# Patient Record
Sex: Male | Born: 1970 | Race: Black or African American | Hispanic: No | Marital: Married | Smoking: Never smoker
Health system: Southern US, Community
[De-identification: ages and names within clinical notes are randomized; demographics above are authoritative.]

---

## 2001-09-12 ENCOUNTER — Emergency Department (HOSPITAL_COMMUNITY): Admission: AC | Admit: 2001-09-12 | Discharge: 2001-09-13 | Payer: Self-pay

## 2001-09-13 ENCOUNTER — Encounter: Payer: Self-pay | Admitting: Emergency Medicine

## 2012-07-03 ENCOUNTER — Encounter: Payer: Self-pay | Admitting: Internal Medicine

## 2012-07-03 ENCOUNTER — Ambulatory Visit (INDEPENDENT_AMBULATORY_CARE_PROVIDER_SITE_OTHER): Payer: Federal, State, Local not specified - PPO | Admitting: Internal Medicine

## 2012-07-03 VITALS — BP 130/86 | HR 69 | Temp 98.8°F | Resp 20 | Wt 284.8 lb

## 2012-07-03 DIAGNOSIS — K439 Ventral hernia without obstruction or gangrene: Secondary | ICD-10-CM | POA: Insufficient documentation

## 2012-07-03 NOTE — Patient Instructions (Signed)
Hernia  A hernia occurs when an internal organ pushes out through a weak spot in the abdominal wall. Hernias most commonly occur in the groin and around the navel. Hernias often can be pushed back into place (reduced). Most hernias tend to get worse over time. Some abdominal hernias can get stuck in the opening (irreducible or incarcerated hernia) and cannot be reduced. An irreducible abdominal hernia which is tightly squeezed into the opening is at risk for impaired blood supply (strangulated hernia). A strangulated hernia is a medical emergency. Because of the risk for an irreducible or strangulated hernia, surgery may be recommended to repair a hernia.  CAUSES    Heavy lifting.   Prolonged coughing.   Straining to have a bowel movement.   A cut (incision) made during an abdominal surgery.  HOME CARE INSTRUCTIONS    Bed rest is not required. You may continue your normal activities.   Avoid lifting more than 10 pounds (4.5 kg) or straining.   Cough gently. If you are a smoker it is best to stop. Even the best hernia repair can break down with the continual strain of coughing. Even if you do not have your hernia repaired, a cough will continue to aggravate the problem.   Do not wear anything tight over your hernia. Do not try to keep it in with an outside bandage or truss. These can damage abdominal contents if they are trapped within the hernia sac.   Eat a normal diet.   Avoid constipation. Straining over long periods of time will increase hernia size and encourage breakdown of repairs. If you cannot do this with diet alone, stool softeners may be used.  SEEK IMMEDIATE MEDICAL CARE IF:    You have a fever.   You develop increasing abdominal pain.   You feel nauseous or vomit.   Your hernia is stuck outside the abdomen, looks discolored, feels hard, or is tender.   You have any changes in your bowel habits or in the hernia that are unusual for you.   You have increased pain or swelling around the  hernia.   You cannot push the hernia back in place by applying gentle pressure while lying down.  MAKE SURE YOU:    Understand these instructions.   Will watch your condition.   Will get help right away if you are not doing well or get worse.  Document Released: 12/09/2005 Document Revised: 11/28/2011 Document Reviewed: 07/28/2008  [employer] Patient Information 2012 [employer].

## 2012-07-03 NOTE — Progress Notes (Signed)
  Subjective:    Patient ID: Louis Glover, male    DOB: [birth year], 41 y.o.   MRN: [id number]  HPI  New to me he wants a second opinion about a hernia in his abd wall that developed more than one year ago, it does not bother him but he tells me that his wife is concerned about the appearance. He saw his PCP Dr. Arvella Nigh recently and has been referred to a general surgeon.  Review of Systems  Constitutional: Negative.   HENT: Negative.   Eyes: Negative.   Respiratory: Negative.   Cardiovascular: Negative.   Gastrointestinal: Negative for nausea, vomiting, abdominal pain, diarrhea, constipation, blood in stool and abdominal distention.  Genitourinary: Negative.   Musculoskeletal: Negative.   Skin: Negative.   Neurological: Negative.   Hematological: Negative.   Psychiatric/Behavioral: Negative.        Objective:   Physical Exam  Vitals reviewed. Constitutional: He is oriented to person, place, and time. He appears well-developed and well-nourished. No distress.  HENT:  Head: Normocephalic and atraumatic.  Mouth/Throat: Oropharynx is clear and moist. No oropharyngeal exudate.  Eyes: Conjunctivae are normal. Right eye exhibits no discharge. Left eye exhibits no discharge. No scleral icterus.  Neck: Normal range of motion. Neck supple. No JVD present. No tracheal deviation present. No thyromegaly present.  Cardiovascular: Normal rate, regular rhythm, normal heart sounds and intact distal pulses.  Exam reveals no gallop and no friction rub.   No murmur heard. Pulmonary/Chest: Effort normal and breath sounds normal. No stridor. No respiratory distress. He has no wheezes. He has no rales. He exhibits no tenderness.  Abdominal: Normal appearance and bowel sounds are normal. He exhibits no shifting dullness, no distension, no pulsatile liver, no fluid wave, no abdominal bruit, no ascites, no pulsatile midline mass and no mass. There is no hepatosplenomegaly, splenomegaly or  hepatomegaly. There is no tenderness. There is no rebound, no guarding and no CVA tenderness. A hernia is present. Hernia confirmed positive in the ventral area (hernia is jsut above the umbilicus nd protrudes only with valsalva). Hernia confirmed negative in the right inguinal area and confirmed negative in the left inguinal area.  Musculoskeletal: Normal range of motion. He exhibits no edema and no tenderness.  Lymphadenopathy:    He has no cervical adenopathy.  Neurological: He is oriented to person, place, and time.  Skin: Skin is warm and dry. No rash noted. He is not diaphoretic. No erythema. No pallor.  Psychiatric: He has a normal mood and affect. His behavior is normal. Judgment and thought content normal.          Assessment & Plan:

## 2012-07-03 NOTE — Assessment & Plan Note (Signed)
I gave him pt ed material about the hernia, he has an appt set up with Dr. Rayburn Ma, he will let me know if he develops any pain or any other symptoms

## 2012-07-14 ENCOUNTER — Encounter (INDEPENDENT_AMBULATORY_CARE_PROVIDER_SITE_OTHER): Payer: Self-pay | Admitting: Surgery

## 2012-08-03 ENCOUNTER — Ambulatory Visit (INDEPENDENT_AMBULATORY_CARE_PROVIDER_SITE_OTHER): Payer: Self-pay | Admitting: Surgery

## 2012-08-10 ENCOUNTER — Emergency Department (HOSPITAL_COMMUNITY)
Admission: EM | Admit: 2012-08-10 | Discharge: 2012-08-10 | Disposition: A | Discharge status: Home / Self Care | Payer: Federal, State, Local not specified - PPO | Attending: Emergency Medicine | Admitting: Emergency Medicine

## 2012-08-10 ENCOUNTER — Encounter (HOSPITAL_COMMUNITY): Payer: Self-pay | Admitting: *Deleted

## 2012-08-10 DIAGNOSIS — R197 Diarrhea, unspecified: Secondary | ICD-10-CM | POA: Insufficient documentation

## 2012-08-10 DIAGNOSIS — R109 Unspecified abdominal pain: Secondary | ICD-10-CM | POA: Insufficient documentation

## 2012-08-10 LAB — URINALYSIS, ROUTINE W REFLEX MICROSCOPIC
Bilirubin Urine: NEGATIVE
Glucose, UA: NEGATIVE mg/dL
Hgb urine dipstick: NEGATIVE
Ketones, ur: NEGATIVE mg/dL
Leukocytes, UA: NEGATIVE
Nitrite: NEGATIVE
Protein, ur: NEGATIVE mg/dL
Specific Gravity, Urine: 1.021 (ref 1.005–1.030)
Urobilinogen, UA: 0.2 mg/dL (ref 0.0–1.0)
pH: 8.5 — ABNORMAL HIGH (ref 5.0–8.0)

## 2012-08-10 LAB — BASIC METABOLIC PANEL
BUN: 12 mg/dL (ref 6–23)
CO2: 27 mEq/L (ref 19–32)
Calcium: 9.3 mg/dL (ref 8.4–10.5)
Chloride: 103 mEq/L (ref 96–112)
Creatinine, Ser: 0.93 mg/dL (ref 0.50–1.35)
GFR calc Af Amer: >90 mL/min (ref >90)
GFR calc non Af Amer: >90 mL/min (ref >90)
Glucose, Bld: 134 mg/dL — ABNORMAL HIGH (ref 70–99)
Potassium: 3.9 mEq/L (ref 3.5–5.1)
Sodium: 140 mEq/L (ref 135–145)

## 2012-08-10 LAB — CBC WITH DIFFERENTIAL/PLATELET
Basophils Absolute: 0 10*3/uL (ref 0.0–0.1)
Basophils Relative: 0 % (ref 0–1)
Eosinophils Absolute: 0 10*3/uL (ref 0.0–0.7)
Eosinophils Relative: 0 % (ref 0–5)
HCT: 43.8 % (ref 39.0–52.0)
Hemoglobin: 16.1 g/dL (ref 13.0–17.0)
Lymphocytes Relative: 11 % — ABNORMAL LOW (ref 12–46)
Lymphs Abs: 1.1 10*3/uL (ref 0.7–4.0)
MCH: 30.7 pg (ref 26.0–34.0)
MCHC: 36.8 g/dL — ABNORMAL HIGH (ref 30.0–36.0)
MCV: 83.6 fL (ref 78.0–100.0)
Monocytes Absolute: 0.3 10*3/uL (ref 0.1–1.0)
Monocytes Relative: 3 % (ref 3–12)
Neutro Abs: 8.5 10*3/uL — ABNORMAL HIGH (ref 1.7–7.7)
Neutrophils Relative %: 86 % — ABNORMAL HIGH (ref 43–77)
Platelets: 215 10*3/uL (ref 150–400)
RBC: 5.24 MIL/uL (ref 4.22–5.81)
RDW: 12.4 % (ref 11.5–15.5)
WBC: 10 10*3/uL (ref 4.0–10.5)

## 2012-08-10 MED ORDER — MORPHINE SULFATE 4 MG/ML IJ SOLN
6.0000 mg | Freq: Once | INTRAMUSCULAR | Status: AC
  Administered 2012-08-10: 6 mg via INTRAVENOUS
  Filled 2012-08-10: qty 2

## 2012-08-10 MED ORDER — ONDANSETRON HCL 4 MG/2ML IJ SOLN
4.0000 mg | Freq: Once | INTRAMUSCULAR | Status: AC
  Administered 2012-08-10: 4 mg via INTRAVENOUS
  Filled 2012-08-10: qty 2

## 2012-08-10 MED ORDER — SODIUM CHLORIDE 0.9 % IV BOLUS (SEPSIS)
1000.0000 mL | Freq: Once | INTRAVENOUS | Status: AC
  Administered 2012-08-10: 1000 mL via INTRAVENOUS

## 2012-08-10 MED ORDER — ONDANSETRON 8 MG PO TBDP: 8.0000 mg | ORAL_TABLET | Freq: Three times a day (TID) | ORAL | Status: AC | PRN

## 2012-08-10 MED ORDER — HYDROCODONE-ACETAMINOPHEN 5-325 MG PO TABS: 1.0000 | ORAL_TABLET | ORAL | Status: AC | PRN

## 2012-08-10 NOTE — ED Notes (Signed)
Pt is here with lower abdominal pain and cramping with diarrhea and reports some blood in the stool.  No vomiting but nauseated.

## 2012-08-10 NOTE — ED Provider Notes (Signed)
History     CSN: [id number]  Arrival date & time 08/10/12  [id number]   First MD Initiated Contact with Patient 08/10/12 [phone]      Chief Complaint  Patient presents with  . Abdominal Pain    (Consider location/radiation/quality/duration/timing/severity/associated sxs/prior treatment) The history is provided by the patient.   patient reports 24 hours of lower abdominal crampy-type pain with associated diarrhea and nausea.  He denies vomiting.  He reports he noted a small amount of blood in his stool this morning.  He has no focal lower abdominal tenderness.  He denies fevers chills.  His had no dysuria or urinary frequency.  Nothing worsens or improves his symptoms.  His pain is mild to moderate in severity  History reviewed. No pertinent past medical history.  History reviewed. No pertinent past surgical history.  Family History  Problem Relation Age of Onset  . Hyperlipidemia Other   . Diabetes Other   . Hyperlipidemia Mother   . Hypertension Mother   . Diabetes Mother   . Hyperlipidemia Father   . Hypertension Father   . Diabetes Father   . Cancer Neg Hx   . Early death Neg Hx   . Heart disease Neg Hx   . Kidney disease Neg Hx   . Stroke Neg Hx     History  Substance Use Topics  . Smoking status: Never Smoker   . Smokeless tobacco: Never Used  . Alcohol Use: 3.0 oz/week    3 Glasses of wine, 2 Shots of liquor per week     occ      Review of Systems  Gastrointestinal: Positive for abdominal pain.  All other systems reviewed and are negative.    Allergies  Shellfish allergy  Home Medications   Current Outpatient Rx  Name Route Sig Dispense Refill  . VITAMIN D PO Oral Take 1 tablet by mouth daily.    Marland Kitchen GARLIC PO Oral Take 1 tablet by mouth daily.    Marland Kitchen VITAMIN E PO Oral Take 1 tablet by mouth daily.    Marland Kitchen HYDROCODONE-ACETAMINOPHEN 5-325 MG PO TABS Oral Take 1 tablet by mouth every 4 (four) hours as needed for pain. 15 tablet 0  . ONDANSETRON 8 MG PO TBDP Oral  Take 1 tablet (8 mg total) by mouth every 8 (eight) hours as needed for nausea. 12 tablet 0    BP 123/78  Pulse 78  Temp 98.4 F (36.9 C) (Oral)  Resp 16  SpO2 98%  Physical Exam  Nursing note and vitals reviewed. Constitutional: He is oriented to person, place, and time. He appears well-developed and well-nourished.  HENT:  Head: Normocephalic and atraumatic.  Eyes: EOM are normal.  Neck: Normal range of motion.  Cardiovascular: Normal rate, regular rhythm, normal heart sounds and intact distal pulses.   Pulmonary/Chest: Effort normal and breath sounds normal. No respiratory distress.  Abdominal: Soft. He exhibits no distension. There is no tenderness.  Musculoskeletal: Normal range of motion.  Neurological: He is alert and oriented to person, place, and time.  Skin: Skin is warm and dry.  Psychiatric: He has a normal mood and affect. Judgment normal.    ED Course  Procedures (including critical care time)  Labs Reviewed  URINALYSIS, ROUTINE W REFLEX MICROSCOPIC - Abnormal; Notable for the following:    pH 8.5 (*)     All other components within normal limits  BASIC METABOLIC PANEL - Abnormal; Notable for the following:    Glucose, Bld 134 (*)  All other components within normal limits  CBC WITH DIFFERENTIAL - Abnormal; Notable for the following:    MCHC 36.8 (*)     Neutrophils Relative 86 (*)     Neutro Abs 8.5 (*)     Lymphocytes Relative 11 (*)     All other components within normal limits   No results found.   1. Abdominal pain   2. Diarrhea       MDM  The patient feels much better at this time.  Repeat abdominal exam is nontender.  Labs are normal.  No elevated white blood cell count.  I suspect this is viral in nature.        Lyanne Co, MD 08/10/12 1320

## 2012-10-02 ENCOUNTER — Ambulatory Visit: Payer: Federal, State, Local not specified - PPO | Admitting: Internal Medicine

## 2012-10-02 DIAGNOSIS — Z0289 Encounter for other administrative examinations: Secondary | ICD-10-CM

## 2012-10-07 ENCOUNTER — Encounter: Payer: Self-pay | Admitting: Internal Medicine

## 2012-10-07 ENCOUNTER — Other Ambulatory Visit (INDEPENDENT_AMBULATORY_CARE_PROVIDER_SITE_OTHER): Payer: Federal, State, Local not specified - PPO

## 2012-10-07 ENCOUNTER — Ambulatory Visit (INDEPENDENT_AMBULATORY_CARE_PROVIDER_SITE_OTHER): Payer: Federal, State, Local not specified - PPO | Admitting: Internal Medicine

## 2012-10-07 VITALS — BP 136/96 | HR 90 | Temp 98.5°F | Resp 16 | Ht 72.0 in | Wt 281.0 lb

## 2012-10-07 DIAGNOSIS — I10 Essential (primary) hypertension: Secondary | ICD-10-CM

## 2012-10-07 DIAGNOSIS — R52 Pain, unspecified: Secondary | ICD-10-CM

## 2012-10-07 DIAGNOSIS — R109 Unspecified abdominal pain: Secondary | ICD-10-CM

## 2012-10-07 LAB — TSH: TSH: 3.22 u[IU]/mL (ref 0.35–5.50)

## 2012-10-07 LAB — COMPREHENSIVE METABOLIC PANEL
AST: 29 U/L (ref 0–37)
Albumin: 4 g/dL (ref 3.5–5.2)
Alkaline Phosphatase: 54 U/L (ref 39–117)
BUN: 12 mg/dL (ref 6–23)
Creatinine, Ser: 1 mg/dL (ref 0.4–1.5)
Potassium: 3.9 mEq/L (ref 3.5–5.1)
Total Bilirubin: 2 mg/dL — ABNORMAL HIGH (ref 0.3–1.2)

## 2012-10-07 LAB — CBC WITH DIFFERENTIAL/PLATELET
Basophils Relative: 0.9 % (ref 0.0–3.0)
Eosinophils Absolute: 0.1 10*3/uL (ref 0.0–0.7)
HCT: 46.1 % (ref 39.0–52.0)
Hemoglobin: 15.9 g/dL (ref 13.0–17.0)
Lymphs Abs: 1.8 10*3/uL (ref 0.7–4.0)
MCHC: 34.5 g/dL (ref 30.0–36.0)
MCV: 87.6 fl (ref 78.0–100.0)
Monocytes Absolute: 0.4 10*3/uL (ref 0.1–1.0)
Neutro Abs: 2.2 10*3/uL (ref 1.4–7.7)
RBC: 5.26 Mil/uL (ref 4.22–5.81)

## 2012-10-07 LAB — URINALYSIS, ROUTINE W REFLEX MICROSCOPIC
Nitrite: NEGATIVE
Specific Gravity, Urine: 1.02 (ref 1.000–1.030)
Urine Glucose: NEGATIVE
Urobilinogen, UA: 0.2 (ref 0.0–1.0)
WBC, UA: NONE SEEN (ref 0–?)

## 2012-10-07 NOTE — Progress Notes (Signed)
Subjective:    Patient ID: Louis Glover, male    DOB: 07-22-1971, 41 y.o.   MRN: [id number]  Abdominal Pain This is a recurrent problem. Episode onset: 3 weeks ago. The onset quality is gradual. The problem occurs intermittently. The problem has been unchanged. The pain is located in the left flank and LUQ. The pain is at a severity of 1/10. The pain is mild. The quality of the pain is aching. The abdominal pain does not radiate. Associated symptoms include constipation. Pertinent negatives include no anorexia, arthralgias, belching, diarrhea, dysuria, fever, flatus, frequency, headaches, hematochezia, hematuria, melena, myalgias, nausea, vomiting or weight loss. Nothing aggravates the pain. The pain is relieved by nothing. He has tried nothing for the symptoms. The treatment provided no relief.      Review of Systems  Constitutional: Negative for fever, chills, weight loss, diaphoresis, activity change, appetite change, fatigue and unexpected weight change.  HENT: Negative.   Eyes: Negative.   Respiratory: Negative for cough, chest tightness, shortness of breath, wheezing and stridor.   Cardiovascular: Negative for chest pain, palpitations and leg swelling.  Gastrointestinal: Positive for abdominal pain and constipation. Negative for nausea, vomiting, diarrhea, blood in stool, melena, hematochezia, abdominal distention, anal bleeding, rectal pain, anorexia and flatus.  Genitourinary: Positive for flank pain. Negative for dysuria, urgency, frequency, hematuria, decreased urine volume, discharge, penile swelling, scrotal swelling, enuresis, difficulty urinating, genital sores, penile pain and testicular pain.  Musculoskeletal: Negative for myalgias, back pain, joint swelling, arthralgias and gait problem.  Skin: Negative for color change, pallor, rash and wound.  Neurological: Negative for dizziness, tremors, seizures, syncope, facial asymmetry, speech difficulty, weakness,  light-headedness, numbness and headaches.  Hematological: Negative for adenopathy. Does not bruise/bleed easily.  Psychiatric/Behavioral: Negative.        Objective:   Physical Exam  Vitals reviewed. Constitutional: He is oriented to person, place, and time. He appears well-developed and well-nourished.  Non-toxic appearance. He does not have a sickly appearance. He does not appear ill. No distress.  HENT:  Head: Normocephalic and atraumatic.  Mouth/Throat: Oropharynx is clear and moist. No oropharyngeal exudate.  Eyes: Conjunctivae normal are normal. Right eye exhibits no discharge. Left eye exhibits no discharge. No scleral icterus.  Neck: Normal range of motion. Neck supple. No JVD present. No tracheal deviation present. No thyromegaly present.  Cardiovascular: Normal rate, regular rhythm, normal heart sounds and intact distal pulses.  Exam reveals no gallop and no friction rub.   No murmur heard. Pulmonary/Chest: Effort normal and breath sounds normal. No stridor. No respiratory distress. He has no wheezes. He has no rales. He exhibits no tenderness.  Abdominal: Soft. Normal appearance and bowel sounds are normal. He exhibits no shifting dullness, no distension, no pulsatile liver, no fluid wave, no abdominal bruit, no ascites, no pulsatile midline mass and no mass. There is no hepatosplenomegaly. There is no tenderness. There is no rigidity, no rebound, no guarding, no CVA tenderness, no tenderness at McBurney's point and negative Murphy's sign. No hernia. Hernia confirmed negative in the ventral area, confirmed negative in the right inguinal area and confirmed negative in the left inguinal area.  Musculoskeletal: Normal range of motion. He exhibits no edema and no tenderness.  Lymphadenopathy:    He has no cervical adenopathy.  Neurological: He is oriented to person, place, and time.  Skin: Skin is warm and dry. No rash noted. He is not diaphoretic. No erythema. No pallor.  Psychiatric:  He has a normal mood and affect. His behavior  is normal. Judgment and thought content normal.      Lab Results  Component Value Date   WBC 10.0 08/10/2012   HGB 16.1 08/10/2012   HCT 43.8 08/10/2012   PLT 215 08/10/2012   GLUCOSE 134* 08/10/2012   NA 140 08/10/2012   K 3.9 08/10/2012   CL 103 08/10/2012   CREATININE 0.93 08/10/2012   BUN 12 08/10/2012   CO2 27 08/10/2012      Assessment & Plan:

## 2012-10-07 NOTE — Patient Instructions (Signed)
Flank Pain  Flank pain refers to pain that is located on the side of the body between the upper abdomen and the back. It can be caused by many things.  CAUSES   Some of the more common causes of flank pain include:   Muscle strain.   Muscle spasms.   A disease of your spine (vertebral disk disease).   A lung infection (pneumonia).   Fluid around your lungs (pulmonary edema).   A kidney infection.   Kidney stones.   A very painful skin rash on only one side of your body (shingles).   Gallbladder disease.  DIAGNOSIS   Blood tests, urine tests, and X-rays may help your caregiver determine what is wrong.  TREATMENT   The treatment of pain depends on the cause. Your caregiver will determine what treatment will work best for you.  HOME CARE INSTRUCTIONS    Home care will depend on the cause of your pain.   Some medications may help relieve the pain. Take medication for relief of pain as directed by your caregiver.   Tell your caregiver about any changes in your pain.   Follow up with your caregiver.  SEEK IMMEDIATE MEDICAL CARE IF:    Your pain is not controlled with medication.   The pain increases.   You have abdominal pain.   You have shortness of breath.   You have persistent nausea or vomiting.   You have swelling in your abdomen.   You feel faint or pass out.   You have a temperature by mouth above 102 F (38.9 C), not controlled by medicine.  MAKE SURE YOU:    Understand these instructions.   Will watch your condition.   Will get help right away if you are not doing well or get worse.  Document Released: 01/30/2006 Document Revised: 03/02/2012 Document Reviewed: 05/26/2010  [employer] Patient Information 2013 [employer].

## 2012-10-08 NOTE — Assessment & Plan Note (Signed)
I will check his labs today to see if he has pancreatitis, kidney stone, renal failure, liver dz., UTI. Will also lok at a plain film of his abd to see if he has retained stool, sbo, ileus, stone.

## 2012-10-08 NOTE — Assessment & Plan Note (Signed)
I will check his labs today to look for secondary causes, will recheck his BP in a few weeks and will treat if needed

## 2012-10-14 ENCOUNTER — Ambulatory Visit (INDEPENDENT_AMBULATORY_CARE_PROVIDER_SITE_OTHER)
Admission: RE | Admit: 2012-10-14 | Discharge: 2012-10-14 | Disposition: A | Discharge status: Home / Self Care | Payer: Federal, State, Local not specified - PPO | Source: Ambulatory Visit | Attending: Internal Medicine | Admitting: Internal Medicine

## 2012-10-14 ENCOUNTER — Encounter: Payer: Self-pay | Admitting: Internal Medicine

## 2012-10-14 ENCOUNTER — Ambulatory Visit (INDEPENDENT_AMBULATORY_CARE_PROVIDER_SITE_OTHER): Payer: Federal, State, Local not specified - PPO | Admitting: Internal Medicine

## 2012-10-14 VITALS — BP 140/96 | HR 79 | Temp 97.7°F | Resp 16

## 2012-10-14 DIAGNOSIS — R109 Unspecified abdominal pain: Secondary | ICD-10-CM

## 2012-10-14 DIAGNOSIS — I10 Essential (primary) hypertension: Secondary | ICD-10-CM

## 2012-10-14 DIAGNOSIS — R52 Pain, unspecified: Secondary | ICD-10-CM

## 2012-10-14 MED ORDER — NEBIVOLOL HCL 10 MG PO TABS: 10.0000 mg | ORAL_TABLET | Freq: Every day | ORAL | Status: DC

## 2012-10-14 NOTE — Assessment & Plan Note (Signed)
Start bystolic to lower the BP

## 2012-10-14 NOTE — Assessment & Plan Note (Addendum)
His labs were normal, he did not get the plain films done last week so I reordered them today, he may need to get a CT done to look for a renal stone Late note - his plain films show some stool retention on the right side, I do not have an explanation for his pain so I have ordered a CT scan

## 2012-10-14 NOTE — Progress Notes (Signed)
Subjective:    Patient ID: Louis Glover, male    DOB: Jun 29, 1971, 41 y.o.   MRN: [id number]  Flank Pain This is a recurrent problem. The current episode started 1 to 4 weeks ago. The problem occurs intermittently. The problem is unchanged. Pain location: left flank. The quality of the pain is described as aching. The pain does not radiate. The pain is at a severity of 1/10. The pain is mild. The pain is worse during the day. The symptoms are aggravated by bending and twisting. Pertinent negatives include no abdominal pain, bladder incontinence, bowel incontinence, chest pain, dysuria, fever, headaches, leg pain, numbness, paresis, paresthesias, pelvic pain, perianal numbness, tingling, weakness or weight loss. He has tried NSAIDs for the symptoms. The treatment provided moderate relief.      Review of Systems  Constitutional: Negative for fever, chills, weight loss, diaphoresis, activity change, appetite change, fatigue and unexpected weight change.  HENT: Negative.   Eyes: Negative.   Respiratory: Negative for cough, chest tightness, shortness of breath, wheezing and stridor.   Cardiovascular: Negative for chest pain, palpitations and leg swelling.  Gastrointestinal: Negative for nausea, vomiting, abdominal pain, diarrhea, constipation, abdominal distention and bowel incontinence.  Genitourinary: Positive for flank pain. Negative for bladder incontinence, dysuria, urgency, frequency, hematuria, decreased urine volume, enuresis, difficulty urinating, genital sores and pelvic pain.  Musculoskeletal: Negative for myalgias, back pain, joint swelling, arthralgias and gait problem.  Skin: Negative for color change, pallor, rash and wound.  Neurological: Negative for dizziness, tingling, tremors, seizures, syncope, facial asymmetry, speech difficulty, weakness, light-headedness, numbness, headaches and paresthesias.  Hematological: Negative for adenopathy. Does not bruise/bleed easily.    Psychiatric/Behavioral: Negative.        Objective:   Physical Exam  Vitals reviewed. Constitutional: He is oriented to person, place, and time. He appears well-developed and well-nourished. No distress.  HENT:  Head: Normocephalic and atraumatic.  Mouth/Throat: Oropharynx is clear and moist. No oropharyngeal exudate.  Eyes: Conjunctivae normal are normal. Right eye exhibits no discharge. Left eye exhibits no discharge. No scleral icterus.  Neck: Normal range of motion. Neck supple. No JVD present. No tracheal deviation present. No thyromegaly present.  Cardiovascular: Normal rate, regular rhythm, normal heart sounds and intact distal pulses.  Exam reveals no gallop and no friction rub.   No murmur heard. Pulmonary/Chest: Effort normal and breath sounds normal. No stridor. No respiratory distress. He has no wheezes. He has no rales. He exhibits no tenderness.  Abdominal: Soft. Normal appearance and bowel sounds are normal. He exhibits no shifting dullness, no distension, no pulsatile liver, no fluid wave, no abdominal bruit, no ascites, no pulsatile midline mass and no mass. There is no hepatosplenomegaly, splenomegaly or hepatomegaly. There is no tenderness. There is no rigidity, no rebound, no guarding, no CVA tenderness, no tenderness at McBurney's point and negative Murphy's sign. Hernia confirmed negative in the right inguinal area and confirmed negative in the left inguinal area.  Musculoskeletal: Normal range of motion. He exhibits no edema and no tenderness.  Lymphadenopathy:    He has no cervical adenopathy.  Neurological: He is oriented to person, place, and time.  Skin: Skin is warm and dry. No rash noted. He is not diaphoretic. No erythema. No pallor.  Psychiatric: He has a normal mood and affect. His behavior is normal. Judgment and thought content normal.     Lab Results  Component Value Date   WBC 4.5 10/07/2012   HGB 15.9 10/07/2012   HCT 46.1 10/07/2012  PLT 244.0  10/07/2012   GLUCOSE 91 10/07/2012   ALT 32 10/07/2012   AST 29 10/07/2012   NA 136 10/07/2012   K 3.9 10/07/2012   CL 102 10/07/2012   CREATININE 1.0 10/07/2012   BUN 12 10/07/2012   CO2 27 10/07/2012   TSH 3.22 10/07/2012   Dg Abd Acute W/chest  10/14/2012  *RADIOLOGY REPORT*  Clinical Data: Left flank pain  ACUTE ABDOMEN SERIES (ABDOMEN 2 VIEW & CHEST 1 VIEW)  Comparison: None.  Findings: Cardiomediastinal silhouette is unremarkable.  Mild elevation of the right hemidiaphragm.  No acute infiltrate or pulmonary edema  There is nonspecific nonobstructive bowel gas pattern.  No pathologic calcifications are identified.  Stool noted in the right colon.  No free abdominal air.  IMPRESSION: No active disease.  Nonspecific nonobstructive bowel gas pattern. No free abdominal air.   Original Report Authenticated By: Natasha Mead, M.D.       Assessment & Plan:

## 2012-10-14 NOTE — Patient Instructions (Signed)
Flank Pain Flank pain refers to pain that is located on the side of the body between the upper abdomen and the back. It can be caused by many things. CAUSES  Some of the more common causes of flank pain include:  Muscle strain.  Muscle spasms.  A disease of your spine (vertebral disk disease).  A lung infection (pneumonia).  Fluid around your lungs (pulmonary edema).  A kidney infection.  Kidney stones.  A very painful skin rash on only one side of your body (shingles).  Gallbladder disease. DIAGNOSIS  Blood tests, urine tests, and X-rays may help your caregiver determine what is wrong. TREATMENT  The treatment of pain depends on the cause. Your caregiver will determine what treatment will work best for you. HOME CARE INSTRUCTIONS   Home care will depend on the cause of your pain.  Some medications may help relieve the pain. Take medication for relief of pain as directed by your caregiver.  Tell your caregiver about any changes in your pain.  Follow up with your caregiver. SEEK IMMEDIATE MEDICAL CARE IF:   Your pain is not controlled with medication.  The pain increases.  You have abdominal pain.  You have shortness of breath.  You have persistent nausea or vomiting.  You have swelling in your abdomen.  You feel faint or pass out.  You have a temperature by mouth above 102 F (38.9 C), not controlled by medicine. MAKE SURE YOU:   Understand these instructions.  Will watch your condition.  Will get help right away if you are not doing well or get worse. Document Released: 01/30/2006 Document Revised: 03/02/2012 Document Reviewed: 05/26/2010 [facility] Patient Information 2013 [place], [place]. Hypertension As your heart beats, it forces blood through your arteries. This force is your blood pressure. If the pressure is too high, it is called hypertension (HTN) or high blood pressure. HTN is dangerous because you may have it and not know it. High blood  pressure may mean that your heart has to work harder to pump blood. Your arteries may be narrow or stiff. The extra work puts you at risk for heart disease, stroke, and other problems.  Blood pressure consists of two numbers, a higher number over a lower, 110/72, for example. It is stated as "110 over 72." The ideal is below 120 for the top number (systolic) and under 80 for the bottom (diastolic). Write down your blood pressure today. You should pay close attention to your blood pressure if you have certain conditions such as:  Heart failure.  Prior heart attack.  Diabetes  Chronic kidney disease.  Prior stroke.  Multiple risk factors for heart disease. To see if you have HTN, your blood pressure should be measured while you are seated with your arm held at the level of the heart. It should be measured at least twice. A one-time elevated blood pressure reading (especially in the Emergency Department) does not mean that you need treatment. There may be conditions in which the blood pressure is different between your right and left arms. It is important to see your caregiver soon for a recheck. Most people have essential hypertension which means that there is not a specific cause. This type of high blood pressure may be lowered by changing lifestyle factors such as:  Stress.  Smoking.  Lack of exercise.  Excessive weight.  Drug/tobacco/alcohol use.  Eating less salt. Most people do not have symptoms from high blood pressure until it has caused damage to the body. Effective  treatment can often prevent, delay or reduce that damage. TREATMENT  When a cause has been identified, treatment for high blood pressure is directed at the cause. There are a large number of medications to treat HTN. These fall into several categories, and your caregiver will help you select the medicines that are best for you. Medications may have side effects. You should review side effects with your caregiver. If  your blood pressure stays high after you have made lifestyle changes or started on medicines,   Your medication(s) may need to be changed.  Other problems may need to be addressed.  Be certain you understand your prescriptions, and know how and when to take your medicine.  Be sure to follow up with your caregiver within the time frame advised (usually within two weeks) to have your blood pressure rechecked and to review your medications.  If you are taking more than one medicine to lower your blood pressure, make sure you know how and at what times they should be taken. Taking two medicines at the same time can result in blood pressure that is too low. SEEK IMMEDIATE MEDICAL CARE IF:  You develop a severe headache, blurred or changing vision, or confusion.  You have unusual weakness or numbness, or a faint feeling.  You have severe chest or abdominal pain, vomiting, or breathing problems. MAKE SURE YOU:   Understand these instructions.  Will watch your condition.  Will get help right away if you are not doing well or get worse. Document Released: 12/09/2005 Document Revised: 03/02/2012 Document Reviewed: 07/29/2008 [facility] Patient Information 2013 [place], [place].

## 2012-10-19 ENCOUNTER — Ambulatory Visit (INDEPENDENT_AMBULATORY_CARE_PROVIDER_SITE_OTHER)
Admission: RE | Admit: 2012-10-19 | Discharge: 2012-10-19 | Disposition: A | Discharge status: Home / Self Care | Payer: Federal, State, Local not specified - PPO | Source: Ambulatory Visit | Attending: Internal Medicine | Admitting: Internal Medicine

## 2012-10-19 DIAGNOSIS — R52 Pain, unspecified: Secondary | ICD-10-CM

## 2012-10-19 DIAGNOSIS — R109 Unspecified abdominal pain: Secondary | ICD-10-CM

## 2012-10-19 MED ORDER — IOHEXOL 300 MG/ML  SOLN
100.0000 mL | Freq: Once | INTRAMUSCULAR | Status: AC | PRN
  Administered 2012-10-19: 100 mL via INTRAVENOUS

## 2013-07-23 IMAGING — CT CT ABD-PELV W/ CM
2 of 4 series · 17 of 46 positions shown, 19 images · IV contrast (Omnipaque 300)
Comparison: Plain films of 10/14/2012.  No prior CT.

CLINICAL DATA: Left-sided abdominal pain and flank pain for few
weeks.

CT ABDOMEN AND PELVIS WITH CONTRAST
TECHNIQUE: Multidetector CT imaging of the abdomen and pelvis was
performed following the standard protocol during bolus
administration of intravenous contrast.
Contrast: 100mL OMNIPAQUE IOHEXOL 300 MG/ML  SOLN

[Series 3: abd/ pel 5mm · axial · 0.77mm/px · z∈[-442,+23]mm · 14 of 103 slices shown, 16 images]
[im 5/103  soft-tissue]
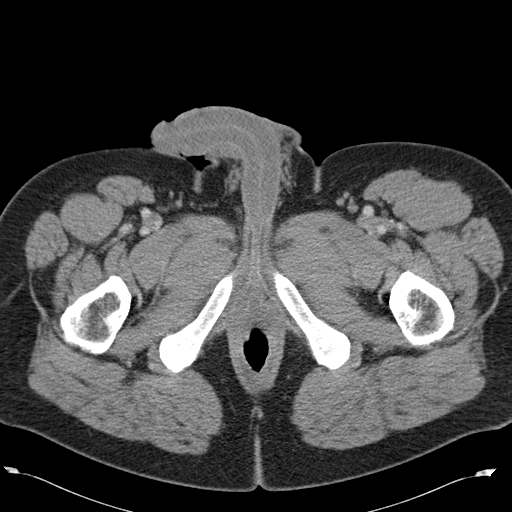
[im 5/103  bone]
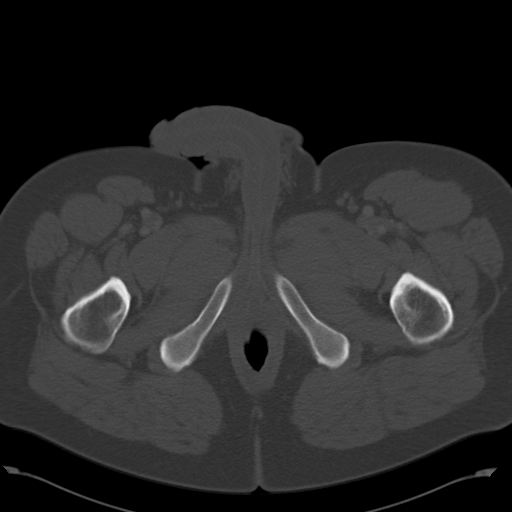
[im 13/103  soft-tissue]
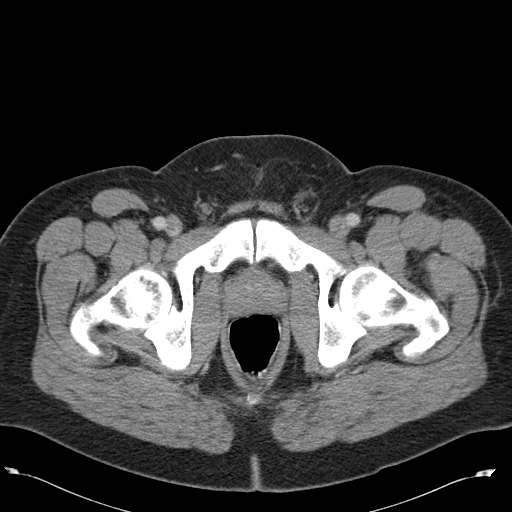
[im 21/103  soft-tissue]
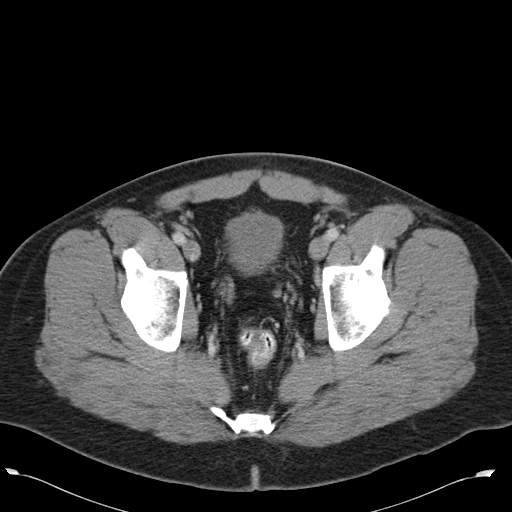
[im 29/103  soft-tissue]
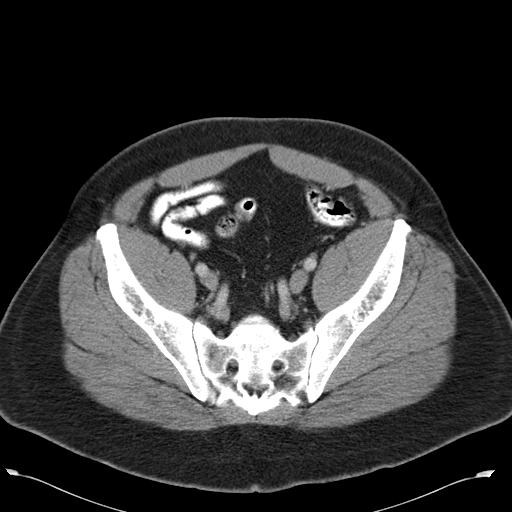
[im 33/103  soft-tissue]
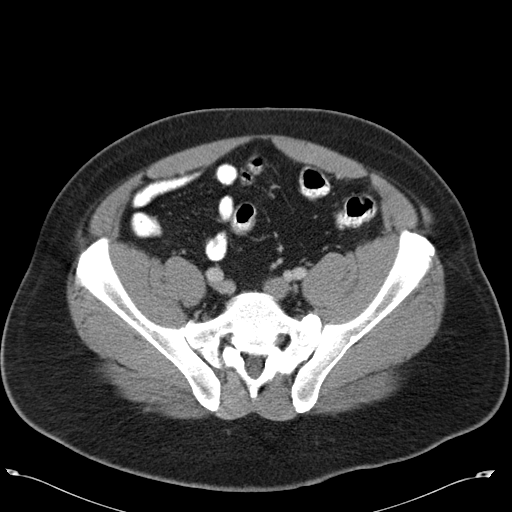
[im 41/103  soft-tissue]
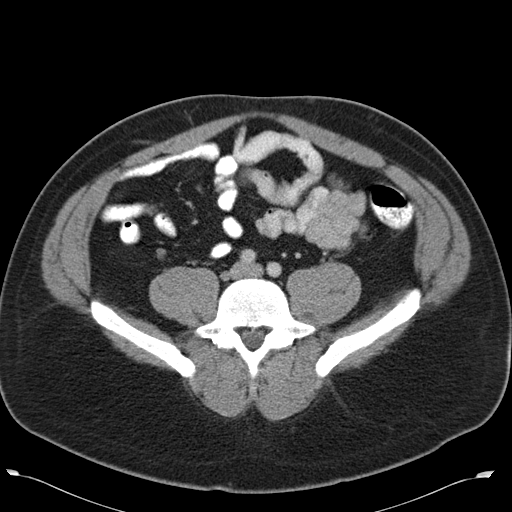
[im 49/103  soft-tissue]
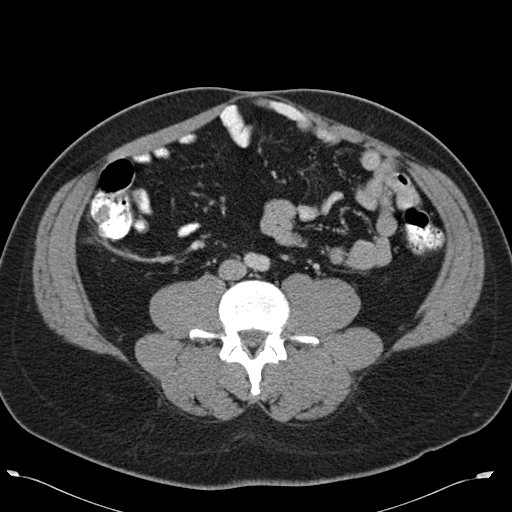
[im 54/103  soft-tissue]
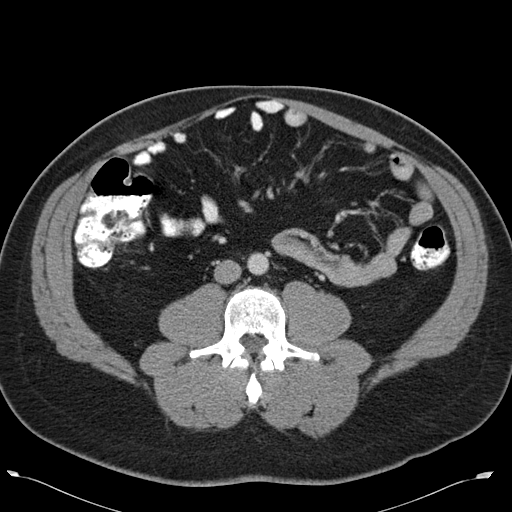
[im 62/103  soft-tissue]
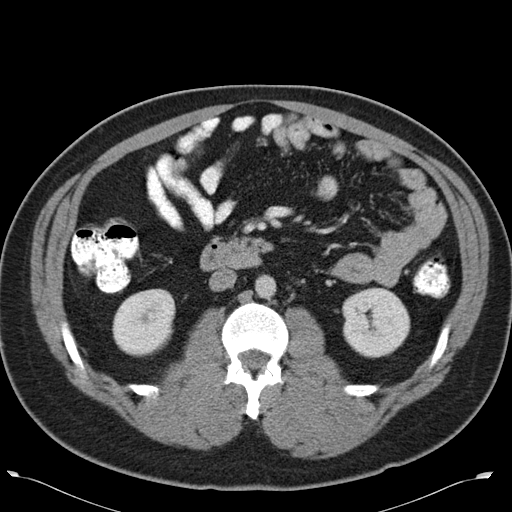
[im 62/103  bone]
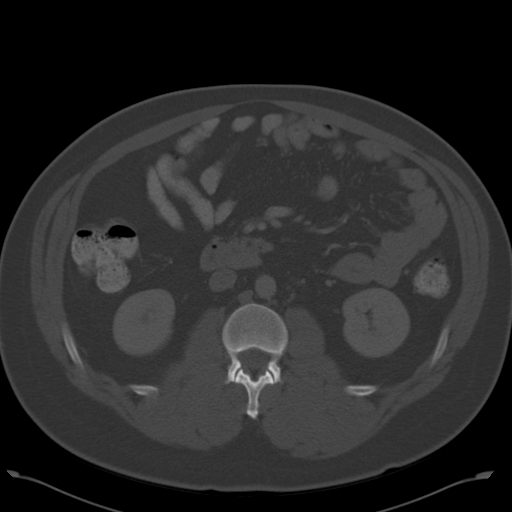
[im 70/103  soft-tissue]
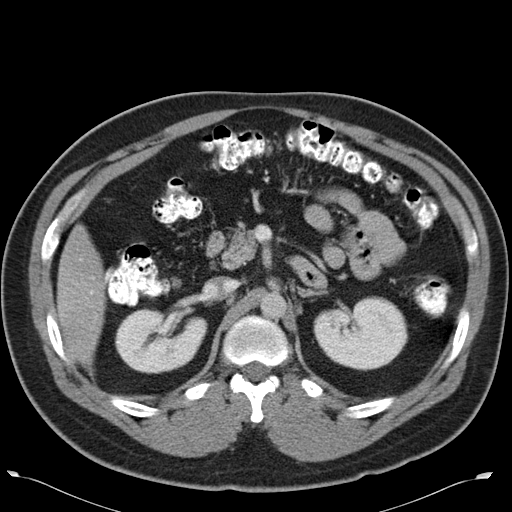
[im 78/103  soft-tissue]
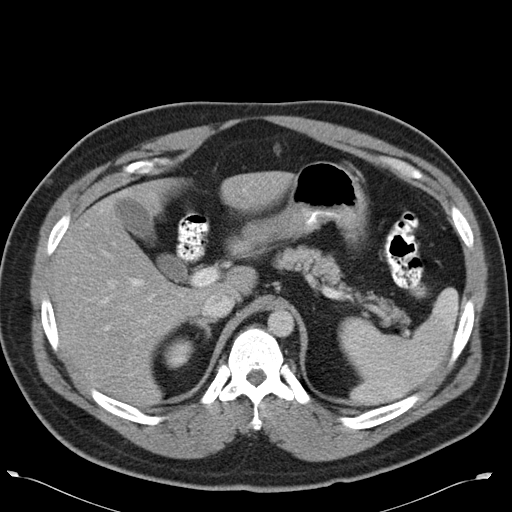
[im 82/103  soft-tissue]
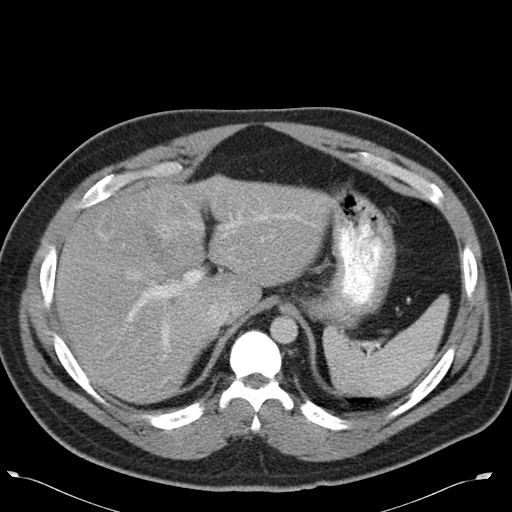
[im 90/103  soft-tissue]
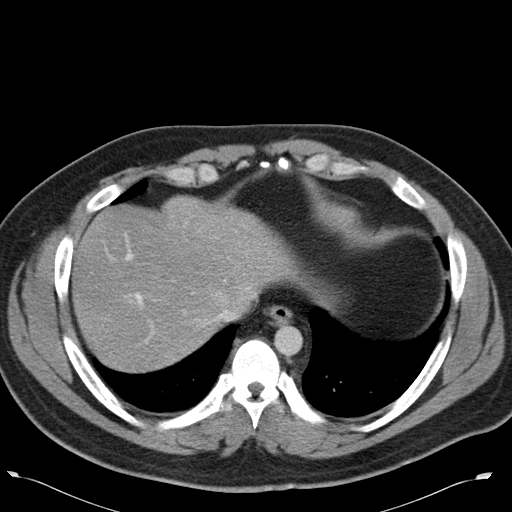
[im 98/103  soft-tissue]
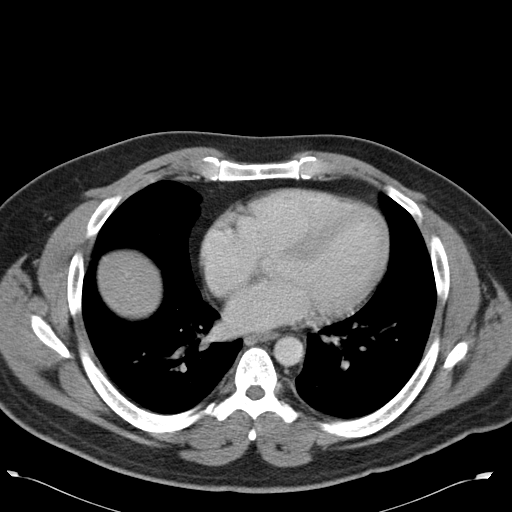

[Series 602: cor · coronal · 1.03mm/px · 3 of 107 slices shown]
[im 36/107  soft-tissue]
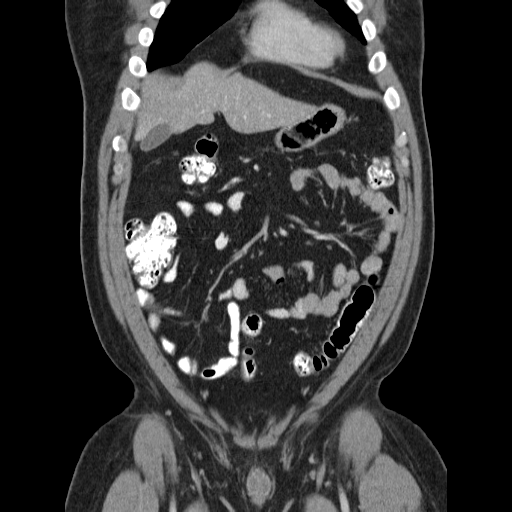
[im 48/107  soft-tissue]
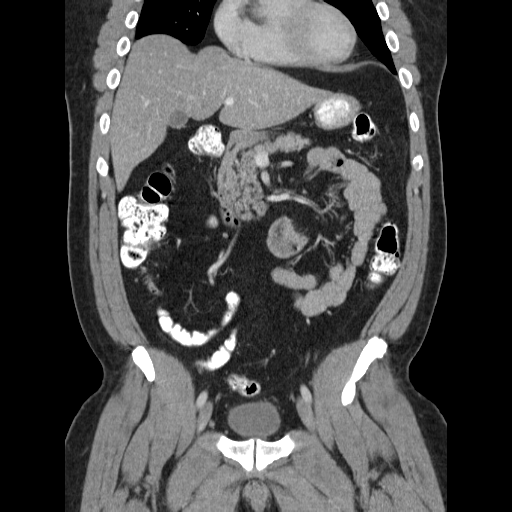
[im 59/107  soft-tissue]
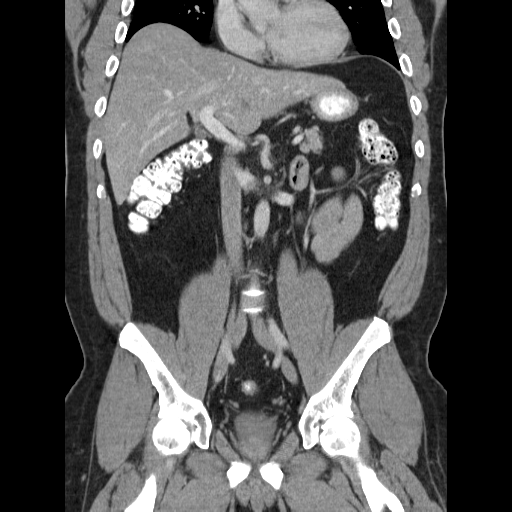

[17 of 46 positions shown; findings below may reference images not displayed]

FINDINGS: Lung bases:  Clear lung bases.  Borderline cardiomegaly,
without pericardial or pleural effusion.

Abdomen/pelvis:  Moderate hepatic steatosis.  Mild hepatomegaly,
18.8 cm cranial caudal.  A too small to characterize lesion in the
posterior aspect of segment 4 on image 21 is most likely a small
cyst.

Normal spleen, stomach, pancreas, gallbladder, biliary tract,
adrenal glands, kidneys.

No retroperitoneal or retrocrural adenopathy.

Normal colon, appendix, and terminal ileum.  Normal small bowel
without abdominal ascites.

Fat containing left inguinal hernia. No pelvic adenopathy.
Normal urinary bladder and prostate.  No significant free fluid.

Bones/Musculoskeletal:  Transitional lumbosacral anatomy.
IMPRESSION: No acute process in the abdomen or pelvis.

Moderate hepatic steatosis and mild hepatomegaly.

## 2013-07-30 ENCOUNTER — Encounter: Payer: Self-pay | Admitting: Internal Medicine

## 2013-07-30 ENCOUNTER — Other Ambulatory Visit (INDEPENDENT_AMBULATORY_CARE_PROVIDER_SITE_OTHER): Payer: Federal, State, Local not specified - PPO

## 2013-07-30 ENCOUNTER — Ambulatory Visit (INDEPENDENT_AMBULATORY_CARE_PROVIDER_SITE_OTHER): Payer: Federal, State, Local not specified - PPO | Admitting: Internal Medicine

## 2013-07-30 VITALS — BP 140/88 | HR 84 | Temp 97.8°F | Resp 16 | Ht 72.0 in | Wt 231.0 lb

## 2013-07-30 DIAGNOSIS — I10 Essential (primary) hypertension: Secondary | ICD-10-CM

## 2013-07-30 DIAGNOSIS — E559 Vitamin D deficiency, unspecified: Secondary | ICD-10-CM

## 2013-07-30 DIAGNOSIS — F329 Major depressive disorder, single episode, unspecified: Secondary | ICD-10-CM

## 2013-07-30 DIAGNOSIS — Z0000 Encounter for general adult medical examination without abnormal findings: Secondary | ICD-10-CM

## 2013-07-30 LAB — CBC WITH DIFFERENTIAL/PLATELET
Basophils Absolute: 0 10*3/uL (ref 0.0–0.1)
Eosinophils Absolute: 0.1 10*3/uL (ref 0.0–0.7)
Hemoglobin: 15.9 g/dL (ref 13.0–17.0)
Lymphocytes Relative: 41.7 % (ref 12.0–46.0)
Monocytes Relative: 8.4 % (ref 3.0–12.0)
Neutro Abs: 1.7 10*3/uL (ref 1.4–7.7)
Neutrophils Relative %: 45.2 % (ref 43.0–77.0)
RBC: 5.18 Mil/uL (ref 4.22–5.81)
RDW: 12.6 % (ref 11.5–14.6)

## 2013-07-30 LAB — COMPREHENSIVE METABOLIC PANEL
Albumin: 4.2 g/dL (ref 3.5–5.2)
BUN: 11 mg/dL (ref 6–23)
CO2: 26 mEq/L (ref 19–32)
Calcium: 9.1 mg/dL (ref 8.4–10.5)
Chloride: 105 mEq/L (ref 96–112)
Creatinine, Ser: 1.1 mg/dL (ref 0.4–1.5)
GFR: 97.49 mL/min (ref >60.00)
Potassium: 3.6 mEq/L (ref 3.5–5.1)

## 2013-07-30 LAB — LDL CHOLESTEROL, DIRECT: Direct LDL: 132.8 mg/dL

## 2013-07-30 LAB — LIPID PANEL
HDL: 38.5 mg/dL — ABNORMAL LOW (ref >39.00)
Triglycerides: 128 mg/dL (ref 0.0–149.0)
VLDL: 25.6 mg/dL (ref 0.0–40.0)

## 2013-07-30 MED ORDER — LEVOMILNACIPRAN HCL ER 40 MG PO CP24: 1.0000 | ORAL_CAPSULE | Freq: Every day | ORAL | Status: DC

## 2013-07-30 NOTE — Assessment & Plan Note (Signed)
He will continue with psychotherapy He was given a work note as requested ("for a few days to get myself together") I have referred to psych as requested I have asked him to start fetzima

## 2013-07-30 NOTE — Assessment & Plan Note (Signed)
His BP is well controlled 

## 2013-07-30 NOTE — Progress Notes (Signed)
Subjective:    Patient ID: Louis Glover, male    DOB: July 23, 1971, 42 y.o.   MRN: [id number]  HPI Comments: He returns and complains of s/s of depression, he has been seeing a psychologist but he wants to see a psychiatrist as well. He has never been treated for depression before. He reports stressors of marital discord, work stress, and ill parents.     Review of Systems  Constitutional: Negative.  Negative for fever, chills, diaphoresis, activity change, appetite change, fatigue and unexpected weight change.  HENT: Negative.   Eyes: Negative.   Respiratory: Negative.  Negative for apnea, cough, choking, shortness of breath, wheezing and stridor.   Cardiovascular: Negative.  Negative for chest pain, palpitations and leg swelling.  Gastrointestinal: Negative.  Negative for nausea, vomiting, abdominal pain, diarrhea and constipation.  Endocrine: Negative.   Genitourinary: Negative.   Musculoskeletal: Negative.   Skin: Negative.   Allergic/Immunologic: Negative.   Neurological: Negative.   Hematological: Negative for adenopathy. Does not bruise/bleed easily.  Psychiatric/Behavioral: Positive for sleep disturbance (sleeps too much) and dysphoric mood (anhedonia, lack of motivation). Negative for suicidal ideas, hallucinations, behavioral problems, confusion, self-injury, decreased concentration and agitation. The patient is not nervous/anxious and is not hyperactive.        Objective:   Physical Exam  Vitals reviewed. Constitutional: He is oriented to person, place, and time. He appears well-developed and well-nourished. No distress.  HENT:  Head: Normocephalic and atraumatic.  Mouth/Throat: Oropharynx is clear and moist. No oropharyngeal exudate.  Eyes: Conjunctivae are normal. Right eye exhibits no discharge. Left eye exhibits no discharge. No scleral icterus.  Neck: Normal range of motion. Neck supple. No JVD present. No tracheal deviation present. No thyromegaly present.   Cardiovascular: Normal rate, regular rhythm, normal heart sounds and intact distal pulses.  Exam reveals no gallop and no friction rub.   No murmur heard. Pulmonary/Chest: Effort normal and breath sounds normal. No stridor. No respiratory distress. He has no wheezes. He has no rales. He exhibits no tenderness.  Abdominal: Soft. Normal appearance and bowel sounds are normal. He exhibits no distension and no mass. There is no hepatosplenomegaly. There is no tenderness. There is no rebound, no guarding and no CVA tenderness. A hernia is present. Hernia confirmed positive in the ventral area. Hernia confirmed negative in the right inguinal area and confirmed negative in the left inguinal area.    Genitourinary: Testes normal and penis normal. Right testis shows no mass, no swelling and no tenderness. Right testis is descended. Left testis shows no mass, no swelling and no tenderness. Left testis is descended. Circumcised. No penile erythema or penile tenderness. No discharge found.  Musculoskeletal: Normal range of motion. He exhibits no edema and no tenderness.  Lymphadenopathy:    He has no cervical adenopathy.       Right: No inguinal adenopathy present.       Left: No inguinal adenopathy present.  Neurological: He is alert and oriented to person, place, and time. He has normal reflexes. He displays normal reflexes. No cranial nerve deficit. He exhibits normal muscle tone. Coordination normal.  Skin: Skin is warm and dry. No rash noted. He is not diaphoretic. No erythema. No pallor.  Psychiatric: His behavior is normal. Judgment and thought content normal. His mood appears not anxious. His affect is not angry, not blunt, not labile and not inappropriate. His speech is not rapid and/or pressured, not delayed and not tangential. Cognition and memory are normal. He exhibits a depressed mood.  He expresses no homicidal and no suicidal ideation. He expresses no suicidal plans and no homicidal plans.       Lab Results  Component Value Date   WBC 4.5 10/07/2012   HGB 15.9 10/07/2012   HCT 46.1 10/07/2012   PLT 244.0 10/07/2012   GLUCOSE 91 10/07/2012   ALT 32 10/07/2012   AST 29 10/07/2012   NA 136 10/07/2012   K 3.9 10/07/2012   CL 102 10/07/2012   CREATININE 1.0 10/07/2012   BUN 12 10/07/2012   CO2 27 10/07/2012   TSH 3.22 10/07/2012      Assessment & Plan:

## 2013-07-30 NOTE — Patient Instructions (Signed)
Health Maintenance, Males A healthy lifestyle and preventative care can promote health and wellness.  Maintain regular health, dental, and eye exams.  Eat a healthy diet. Foods like vegetables, fruits, whole grains, low-fat dairy products, and lean protein foods contain the nutrients you need without too many calories. Decrease your intake of foods high in solid fats, added sugars, and salt. Get information about a proper diet from your caregiver, if necessary.  Regular physical exercise is one of the most important things you can do for your health. Most adults should get at least 150 minutes of moderate-intensity exercise (any activity that increases your heart rate and causes you to sweat) each week. In addition, most adults need muscle-strengthening exercises on 2 or more days a week.   Maintain a healthy weight. The body mass index (BMI) is a screening tool to identify possible weight problems. It provides an estimate of body fat based on height and weight. Your caregiver can help determine your BMI, and can help you achieve or maintain a healthy weight. For adults 20 years and older:  A BMI below 18.5 is considered underweight.  A BMI of 18.5 to 24.9 is normal.  A BMI of 25 to 29.9 is considered overweight.  A BMI of 30 and above is considered obese.  Maintain normal blood lipids and cholesterol by exercising and minimizing your intake of saturated fat. Eat a balanced diet with plenty of fruits and vegetables. Blood tests for lipids and cholesterol should begin at age 20 and be repeated every 5 years. If your lipid or cholesterol levels are high, you are over 50, or you are a high risk for heart disease, you may need your cholesterol levels checked more frequently.Ongoing high lipid and cholesterol levels should be treated with medicines, if diet and exercise are not effective.  If you smoke, find out from your caregiver how to quit. If you do not use tobacco, do not start.  If you  choose to drink alcohol, do not exceed 2 drinks per day. One drink is considered to be 12 ounces (355 mL) of beer, 5 ounces (148 mL) of wine, or 1.5 ounces (44 mL) of liquor.  Avoid use of street drugs. Do not share needles with anyone. Ask for help if you need support or instructions about stopping the use of drugs.  High blood pressure causes heart disease and increases the risk of stroke. Blood pressure should be checked at least every 1 to 2 years. Ongoing high blood pressure should be treated with medicines if weight loss and exercise are not effective.  If you are 45 to 40 years old, ask your caregiver if you should take aspirin to prevent heart disease.  Diabetes screening involves taking a blood sample to check your fasting blood sugar level. This should be done once every 3 years, after age 45, if you are within normal weight and without risk factors for diabetes. Testing should be considered at a younger age or be carried out more frequently if you are overweight and have at least 1 risk factor for diabetes.  Colorectal cancer can be detected and often prevented. Most routine colorectal cancer screening begins at the age of 50 and continues through age 75. However, your caregiver may recommend screening at an earlier age if you have risk factors for colon cancer. On a yearly basis, your caregiver may provide home test kits to check for hidden blood in the stool. Use of a small camera at the end of a tube,   to directly examine the colon (sigmoidoscopy or colonoscopy), can detect the earliest forms of colorectal cancer. Talk to your caregiver about this at age 57, when routine screening begins. Direct examination of the colon should be repeated every 5 to 10 years through age 58, unless early forms of pre-cancerous polyps or small growths are found.  Hepatitis C blood testing is recommended for all people born from [birth year] through 1963 and any individual with known risks for hepatitis C.  Healthy  men should no longer receive prostate-specific antigen (PSA) blood tests as part of routine cancer screening. Consult with your caregiver about prostate cancer screening.  Testicular cancer screening is not recommended for adolescents or adult males who have no symptoms. Screening includes self-exam, caregiver exam, and other screening tests. Consult with your caregiver about any symptoms you have or any concerns you have about testicular cancer.  Practice safe sex. Use condoms and avoid high-risk sexual practices to reduce the spread of sexually transmitted infections (STIs).  Use sunscreen with a sun protection factor (SPF) of 30 or greater. Apply sunscreen liberally and repeatedly throughout the day. You should seek shade when your shadow is shorter than you. Protect yourself by wearing long sleeves, pants, a wide-brimmed hat, and sunglasses year round, whenever you are outdoors.  Notify your caregiver of new moles or changes in moles, especially if there is a change in shape or color. Also notify your caregiver if a mole is larger than the size of a pencil eraser.  A one-time screening for abdominal aortic aneurysm (AAA) and surgical repair of large AAAs by sound wave imaging (ultrasonography) is recommended for ages 76 to 40 years who are current or former smokers.  Stay current with your immunizations. Document Released: 06/06/2008 Document Revised: 03/02/2012 Document Reviewed: 05/06/2011 [facility] Patient Information 2014 [place], [place]. Depression, Adult Depression refers to feeling sad, low, down in the dumps, blue, gloomy, or empty. In general, there are two kinds of depression: 1. Depression that we all experience from time to time because of upsetting life experiences, including the loss of a job or the ending of a relationship (normal sadness or normal grief). This kind of depression is considered normal, is short lived, and resolves within a few days to 2 weeks. (Depression  experienced after the loss of a loved one is called bereavement. Bereavement often lasts longer than 2 weeks but normally gets better with time.) 2. Clinical depression, which lasts longer than normal sadness or normal grief or interferes with your ability to function at home, at work, and in school. It also interferes with your personal relationships. It affects almost every aspect of your life. Clinical depression is an illness. Symptoms of depression also can be caused by conditions other than normal sadness and grief or clinical depression. Examples of these conditions are listed as follows:  Physical illness Some physical illnesses, including underactive thyroid gland (hypothyroidism), severe anemia, specific types of cancer, diabetes, uncontrolled seizures, heart and lung problems, strokes, and chronic pain are commonly associated with symptoms of depression.  Side effects of some prescription medicine In some people, certain types of prescription medicine can cause symptoms of depression.  Substance abuse Abuse of alcohol and illicit drugs can cause symptoms of depression. SYMPTOMS Symptoms of normal sadness and normal grief include the following:  Feeling sad or crying for short periods of time.  Not caring about anything (apathy).  Difficulty sleeping or sleeping too much.  No longer able to enjoy the things you used to enjoy.  Desire to be by oneself all the time (social isolation).  Lack of energy or motivation.  Difficulty concentrating or remembering.  Change in appetite or weight.  Restlessness or agitation. Symptoms of clinical depression include the same symptoms of normal sadness or normal grief and also the following symptoms:  Feeling sad or crying all the time.  Feelings of guilt or worthlessness.  Feelings of hopelessness or helplessness.  Thoughts of suicide or the desire to harm yourself (suicidal ideation).  Loss of touch with reality (psychotic  symptoms). Seeing or hearing things that are not real (hallucinations) or having false beliefs about your life or the people around you (delusions and paranoia). DIAGNOSIS  The diagnosis of clinical depression usually is based on the severity and duration of the symptoms. Your caregiver also will ask you questions about your medical history and substance use to find out if physical illness, use of prescription medicine, or substance abuse is causing your depression. Your caregiver also may order blood tests. TREATMENT  Typically, normal sadness and normal grief do not require treatment. However, sometimes antidepressant medicine is prescribed for bereavement to ease the depressive symptoms until they resolve. The treatment for clinical depression depends on the severity of your symptoms but typically includes antidepressant medicine, counseling with a mental health professional, or a combination of both. Your caregiver will help to determine what treatment is best for you. Depression caused by physical illness usually goes away with appropriate medical treatment of the illness. If prescription medicine is causing depression, talk with your caregiver about stopping the medicine, decreasing the dose, or substituting another medicine. Depression caused by abuse of alcohol or illicit drugs abuse goes away with abstinence from these substances. Some adults need professional help in order to stop drinking or using drugs. SEEK IMMEDIATE CARE IF:  You have thoughts about hurting yourself or others.  You lose touch with reality (have psychotic symptoms).  You are taking medicine for depression and have a serious side effect. FOR MORE INFORMATION National [employer] on Mental Illness: www.nami.[employer]: http://www.maynard.net/ Document Released: 12/06/2000 Document Revised: 06/09/2012 Document Reviewed: 03/09/2012 [facility] Patient Information 2014 [place], [place].

## 2013-07-30 NOTE — Assessment & Plan Note (Signed)
Exam done Labs ordered Pt ed material was given 

## 2013-08-01 ENCOUNTER — Encounter: Payer: Self-pay | Admitting: Internal Medicine

## 2013-08-01 DIAGNOSIS — E559 Vitamin D deficiency, unspecified: Secondary | ICD-10-CM | POA: Insufficient documentation

## 2013-08-01 MED ORDER — CHOLECALCIFEROL 1.25 MG (50000 UT) PO TABS: 1.0000 | ORAL_TABLET | ORAL | Status: DC

## 2013-08-01 NOTE — Addendum Note (Signed)
Addended by: Etta Grandchild on: 08/01/2013 10:10 AM   Modules accepted: Orders

## 2013-10-27 ENCOUNTER — Encounter (HOSPITAL_COMMUNITY): Payer: Self-pay | Admitting: Emergency Medicine

## 2013-10-27 ENCOUNTER — Emergency Department (HOSPITAL_COMMUNITY)
Admission: EM | Admit: 2013-10-27 | Discharge: 2013-10-27 | Disposition: A | Discharge status: Home / Self Care | Payer: Federal, State, Local not specified - PPO | Source: Home / Self Care | Attending: Family Medicine | Admitting: Family Medicine

## 2013-10-27 DIAGNOSIS — A09 Infectious gastroenteritis and colitis, unspecified: Secondary | ICD-10-CM

## 2013-10-27 MED ORDER — LOPERAMIDE HCL 2 MG PO TABS: 2.0000 mg | ORAL_TABLET | Freq: Four times a day (QID) | ORAL | Status: DC | PRN

## 2013-10-27 MED ORDER — CIPROFLOXACIN HCL 500 MG PO TABS: 500.0000 mg | ORAL_TABLET | Freq: Two times a day (BID) | ORAL | Status: DC

## 2013-10-27 NOTE — ED Notes (Signed)
Returned from [place] trip this weekend, having GI problems since return

## 2013-10-27 NOTE — ED Provider Notes (Signed)
Louis Glover is a 42 y.o. male who presents to Urgent Care today for diarrhea. Patient has had a running nonbloody diarrhea since returning from the [place] on Sunday. He drank water there. He denies any abdominal pain fevers chills nausea vomiting or diarrhea. He has not tried any medications yet. He feels well otherwise.   History reviewed. No pertinent past medical history. History  Substance Use Topics  . Smoking status: Never Smoker   . Smokeless tobacco: Never Used  . Alcohol Use: 3.0 oz/week    3 Glasses of wine, 2 Shots of liquor per week     Comment: occ   ROS as above Medications reviewed. No current facility-administered medications for this encounter.   Current Outpatient Prescriptions  Medication Sig Dispense Refill  . Cholecalciferol 50000 UNITS TABS Take 1 tablet by mouth once a week.  12 tablet  3  . GARLIC PO Take 1 tablet by mouth daily.      . Levomilnacipran HCl ER (FETZIMA) 40 MG CP24 Take 1 capsule by mouth daily.  21 capsule  0  . nebivolol (BYSTOLIC) 10 MG tablet Take 1 tablet (10 mg total) by mouth daily.  56 tablet  0  . VITAMIN E PO Take 1 tablet by mouth daily.      . ciprofloxacin (CIPRO) 500 MG tablet Take 1 tablet (500 mg total) by mouth every 12 (twelve) hours.  6 tablet  0  . loperamide (IMODIUM A-D) 2 MG tablet Take 1 tablet (2 mg total) by mouth 4 (four) times daily as needed for diarrhea or loose stools.  30 tablet  0    Exam:  BP 148/95  Pulse 84  Temp(Src) 97.8 F (36.6 C) (Oral)  Resp 20  SpO2 100% Gen: Well NAD HEENT: EOMI,  MMM Lungs: CTABL Nl WOB Heart: RRR no MRG Abd: NABS, NT, ND, soft Exts: Non edematous BL  LE, warm and well perfused.    Assessment and Plan: 42 y.o. male with traveler's diarrhea. Plan to use Cipro and Imodium.  Followup as needed Discussed warning signs or symptoms. Please see discharge instructions. Patient expresses understanding.      Rodolph Bong, MD 10/27/13 [phone]

## 2013-10-28 ENCOUNTER — Other Ambulatory Visit: Payer: Self-pay

## 2014-04-13 ENCOUNTER — Encounter: Payer: Self-pay | Admitting: Podiatry

## 2014-04-13 ENCOUNTER — Ambulatory Visit (INDEPENDENT_AMBULATORY_CARE_PROVIDER_SITE_OTHER): Payer: Federal, State, Local not specified - PPO

## 2014-04-13 ENCOUNTER — Ambulatory Visit (INDEPENDENT_AMBULATORY_CARE_PROVIDER_SITE_OTHER): Payer: Federal, State, Local not specified - PPO | Admitting: Podiatry

## 2014-04-13 VITALS — BP 142/86 | HR 74 | Resp 12

## 2014-04-13 DIAGNOSIS — R52 Pain, unspecified: Secondary | ICD-10-CM

## 2014-04-13 DIAGNOSIS — M722 Plantar fascial fibromatosis: Secondary | ICD-10-CM

## 2014-04-13 NOTE — Progress Notes (Signed)
   Subjective:    Patient ID: Louis Glover, male    DOB: 10/26/1971, 43 y.o.   MRN: [id number]  HPI  PT STATED BOTH ARCH ARE HURTING FOR 1 YEAR. THE FEET ARE GETTING WORSE. THE FEET GET AGGRAVATED BY WALKING AND TRIED TO USED FOOT CREAM AND IT HELP SOME.   ALSO, RT FOOT 2ND AND LT 5TH TOENAIL IS HURTING  FOR 1 YEAR. THE TOENAILS BEEN THE SAME JUST HURTING SOMETIMES. TRIED NO TREATMENT.   Review of Systems  All other systems reviewed and are negative.      Objective:   Physical Exam Orientated [phone] 43 year old black male  Vascular: DP and PT pulses 2/4 bilaterally  Neurological: Knee and ankle reflexes equal and reactive bilaterally  Dermatological: The distal second right toenail area is tender to pressure, with associated mallet toe deformity. There is no deformity in the second right toenail noted.  Musculoskeletal: There is no restriction ankle, midtarsal, metatarsal phalangeal joints bilaterally. There is palpable tenderness in the mid fascial band left without a palpable lesions. There is tenderness to palpation in the medial navicular areas bilaterally.  Upon weightbearing patient is able to heel off unilaterally and bilaterally. The heels upon heel off invert bilaterally.  Mallet toe deformity second right noted.  X-ray report right foot   Intact bony structure without fracture or dislocation noted. Posterior calcaneal spur noted. 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  Radiographic impression: No acute bony abnormality noted right foot  X-ray examination left foot   Intact bony structure without fracture and/or dislocation noted. Small posterior calcaneal spur noted. 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  Radiographic impression: No acute bony abnormality noted in the left foot        Assessment & Plan:   Assessment: Plantar fasciitis bilaterally, left more symptomatic than right  Mild  posterior tibial insertional tendinitis at the level of the medial navicular areas bilaterally  Plan: Am recommending accommodative foot orthotic for the indication of bilateral fasciitis, and insertional posterior tibial tendinitis, bilaterally  A digital scan was obtained of the right and left feet for the fabrica, with Spenco top cover tion of a custom foot orthotic: Full length, 3D, with Spenco top cover ordered.  Notify patient on receipt of orthotics

## 2014-04-13 NOTE — Patient Instructions (Signed)

## 2014-04-14 ENCOUNTER — Encounter: Payer: Self-pay | Admitting: Podiatry

## 2014-05-04 ENCOUNTER — Encounter: Payer: Self-pay | Admitting: Podiatry

## 2014-05-04 ENCOUNTER — Ambulatory Visit (INDEPENDENT_AMBULATORY_CARE_PROVIDER_SITE_OTHER): Payer: Federal, State, Local not specified - PPO | Admitting: Podiatry

## 2014-05-04 VITALS — BP 153/97 | HR 69 | Resp 17 | Ht 72.0 in | Wt 265.0 lb

## 2014-05-04 DIAGNOSIS — M722 Plantar fascial fibromatosis: Secondary | ICD-10-CM

## 2014-05-05 NOTE — Progress Notes (Signed)
Patient ID: Louis NossChristopher W Renne, male   DOB: [birth year], 43 y.o.   MRN: [id number]  Subjective: This patient presents for dispensing of custom foot orthotics  Objective: Full length 3D orthotics with Spenco top cover contour satisfactorily  Assessment: Satisfactory fit of orthotics in treatment of plantar fasciitis  Plan: Orthotics dispensed to wear instructions provided  Reappoint x6 weeks

## 2014-06-15 ENCOUNTER — Ambulatory Visit: Payer: Federal, State, Local not specified - PPO | Admitting: Podiatry

## 2014-10-07 ENCOUNTER — Other Ambulatory Visit: Payer: Self-pay

## 2017-02-20 ENCOUNTER — Encounter: Payer: Self-pay | Admitting: Podiatry

## 2017-02-20 ENCOUNTER — Ambulatory Visit (INDEPENDENT_AMBULATORY_CARE_PROVIDER_SITE_OTHER): Payer: Federal, State, Local not specified - PPO | Admitting: Podiatry

## 2017-02-20 ENCOUNTER — Ambulatory Visit (INDEPENDENT_AMBULATORY_CARE_PROVIDER_SITE_OTHER): Payer: Federal, State, Local not specified - PPO

## 2017-02-20 DIAGNOSIS — M79674 Pain in right toe(s): Secondary | ICD-10-CM | POA: Diagnosis not present

## 2017-02-20 DIAGNOSIS — M205X1 Other deformities of toe(s) (acquired), right foot: Secondary | ICD-10-CM

## 2017-02-20 MED ORDER — DICLOFENAC SODIUM 75 MG PO TBEC: 75.0000 mg | DELAYED_RELEASE_TABLET | Freq: Two times a day (BID) | ORAL | 2 refills | Status: AC

## 2017-02-21 NOTE — Progress Notes (Signed)
Subjective:     Patient ID: Louis Glover, male   DOB: 22-Feb-1971, 46 y.o.   MRN: [id number]  HPI patient presents stating I been getting some pain in the side of my big toe and that had previous surgery on my left one and I'm not sure what may be causing this   Review of Systems  All other systems reviewed and are negative.      Objective:   Physical Exam  Constitutional: He is oriented to person, place, and time.  Cardiovascular: Intact distal pulses.   Musculoskeletal: Normal range of motion.  Neurological: He is oriented to person, place, and time.  Skin: Skin is warm.  Nursing note and vitals reviewed.  neurovascular status intact muscle strength adequate range of motion within normal limits with patient found to have discomfort on the medial side of the right hallux with moderate limitation of motion. It is not within the nail but it appears to be within the toe itself and is nondescript in its place and there is mild discomfort in the joint but not severe     Assessment:     Appears to be moderate functional hallux limitus right with irritation of the side of the toe    Plan:     H&P conditions reviewed and at this time I applied a silicone pad to the right hallux to see if it reduces some pressure and discussed functional limitus orthotic therapy. Patient be seen back to recheck  X-rays indicate that there is no signs of significant structural arthritis and at this time it appears functional

## 2017-03-20 ENCOUNTER — Ambulatory Visit: Payer: Federal, State, Local not specified - PPO | Admitting: Podiatry
